# Patient Record
Sex: Male | Born: 1995 | Race: White | Hispanic: No | Marital: Married | Smoking: Never smoker
Health system: Southern US, Community
[De-identification: ages and names within clinical notes are randomized; demographics above are authoritative.]

---
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
## 2016-12-31 ENCOUNTER — Other Ambulatory Visit: Payer: Self-pay | Admitting: Occupational Medicine

## 2016-12-31 ENCOUNTER — Ambulatory Visit
Admission: RE | Admit: 2016-12-31 | Discharge: 2016-12-31 | Disposition: A | Discharge status: Home / Self Care | Payer: No Typology Code available for payment source | Source: Ambulatory Visit | Attending: Occupational Medicine | Admitting: Occupational Medicine

## 2016-12-31 DIAGNOSIS — Z021 Encounter for pre-employment examination: Secondary | ICD-10-CM

## 2017-04-04 ENCOUNTER — Ambulatory Visit
Admission: RE | Admit: 2017-04-04 | Discharge: 2017-04-04 | Disposition: A | Discharge status: Home / Self Care | Payer: Worker's Compensation | Source: Ambulatory Visit | Attending: Nurse Practitioner | Admitting: Nurse Practitioner

## 2017-04-04 ENCOUNTER — Other Ambulatory Visit: Payer: Self-pay | Admitting: Nurse Practitioner

## 2017-04-04 DIAGNOSIS — M25572 Pain in left ankle and joints of left foot: Secondary | ICD-10-CM

## 2017-04-04 DIAGNOSIS — R609 Edema, unspecified: Secondary | ICD-10-CM
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
## 2019-09-22 ENCOUNTER — Other Ambulatory Visit: Payer: Self-pay | Admitting: Family Medicine

## 2019-09-22 DIAGNOSIS — N5089 Other specified disorders of the male genital organs: Secondary | ICD-10-CM

## 2019-09-22 DIAGNOSIS — R1909 Other intra-abdominal and pelvic swelling, mass and lump: Secondary | ICD-10-CM

## 2019-09-30 ENCOUNTER — Other Ambulatory Visit: Payer: Self-pay

## 2019-09-30 ENCOUNTER — Ambulatory Visit
Admission: RE | Admit: 2019-09-30 | Discharge: 2019-09-30 | Disposition: A | Discharge status: Home / Self Care | Payer: 59 | Source: Ambulatory Visit | Attending: Family Medicine | Admitting: Family Medicine

## 2019-09-30 DIAGNOSIS — R1909 Other intra-abdominal and pelvic swelling, mass and lump: Secondary | ICD-10-CM

## 2019-09-30 DIAGNOSIS — N5089 Other specified disorders of the male genital organs: Secondary | ICD-10-CM

## 2020-05-15 ENCOUNTER — Encounter (HOSPITAL_BASED_OUTPATIENT_CLINIC_OR_DEPARTMENT_OTHER): Payer: Self-pay | Admitting: Emergency Medicine

## 2020-05-15 ENCOUNTER — Other Ambulatory Visit: Payer: Self-pay

## 2020-05-15 ENCOUNTER — Emergency Department (HOSPITAL_BASED_OUTPATIENT_CLINIC_OR_DEPARTMENT_OTHER): Payer: No Typology Code available for payment source

## 2020-05-15 ENCOUNTER — Emergency Department (HOSPITAL_BASED_OUTPATIENT_CLINIC_OR_DEPARTMENT_OTHER)
Admission: EM | Admit: 2020-05-15 | Discharge: 2020-05-15 | Disposition: A | Discharge status: Home / Self Care | Payer: No Typology Code available for payment source | Attending: Emergency Medicine | Admitting: Emergency Medicine

## 2020-05-15 DIAGNOSIS — Y9339 Activity, other involving climbing, rappelling and jumping off: Secondary | ICD-10-CM | POA: Insufficient documentation

## 2020-05-15 DIAGNOSIS — S6991XA Unspecified injury of right wrist, hand and finger(s), initial encounter: Secondary | ICD-10-CM | POA: Diagnosis not present

## 2020-05-15 DIAGNOSIS — X501XXA Overexertion from prolonged static or awkward postures, initial encounter: Secondary | ICD-10-CM | POA: Diagnosis not present

## 2020-05-15 DIAGNOSIS — Y990 Civilian activity done for income or pay: Secondary | ICD-10-CM | POA: Diagnosis not present

## 2020-05-15 MED ORDER — IBUPROFEN 400 MG PO TABS
600.0000 mg | ORAL_TABLET | Freq: Once | ORAL | Status: AC
  Administered 2020-05-15: 600 mg via ORAL
  Filled 2020-05-15: qty 1
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
## 2020-05-15 NOTE — Discharge Instructions (Signed)
Your x-ray does not show any evidence of fracture and I suspect a wrist sprain, use brace for support, you can ice and elevate the wrist and use Motrin and Tylenol for pain.  If pain is not improving after 1 week please follow-up with Dr. Jordan Likes with sports medicine.  In some cases people can have a scaphoid fracture injury that does not show up on initial imaging.

## 2020-05-15 NOTE — ED Triage Notes (Signed)
Reports hyperextended wrist while jumping a fence at work.

## 2020-06-08 ENCOUNTER — Telehealth: Payer: Self-pay | Admitting: Family Medicine

## 2020-06-08 NOTE — Telephone Encounter (Signed)
Called pt to offer ED follow-up appt w/ Dr.Schmitz--no answer, message left. --glh

## 2020-06-22 ENCOUNTER — Encounter: Payer: Self-pay | Admitting: Family Medicine

## 2020-06-22 ENCOUNTER — Other Ambulatory Visit: Payer: Self-pay

## 2020-06-22 ENCOUNTER — Ambulatory Visit: Payer: Self-pay

## 2020-06-22 ENCOUNTER — Ambulatory Visit (INDEPENDENT_AMBULATORY_CARE_PROVIDER_SITE_OTHER): Payer: Worker's Compensation | Admitting: Family Medicine

## 2020-06-22 VITALS — BP 119/83 | HR 80 | Ht 73.0 in | Wt 230.0 lb

## 2020-06-22 DIAGNOSIS — M25531 Pain in right wrist: Secondary | ICD-10-CM

## 2020-06-22 DIAGNOSIS — S63591A Other specified sprain of right wrist, initial encounter: Secondary | ICD-10-CM | POA: Diagnosis not present

## 2020-06-22 NOTE — Patient Instructions (Signed)
Nice to meet you Please use ice as needed  Please try to keep your wrist in neutral position with lifting   Please send me a message in MyChart with any questions or updates.  We will setup a virtual visit on the MRI is resulted.   --Dr. Jordan Likes

## 2020-06-22 NOTE — Assessment & Plan Note (Signed)
Injury occurred around 10/25 while at work.  Has had ongoing pain since this time.  No improvement with conservative measures or medications.  Has an effusion on exam and suspect either a TFCC tear or a tear of the scapholunate ligament. -Counseled on home exercise therapy and supportive care. -MRI to evaluate for TFCC tear.

## 2020-06-22 NOTE — Progress Notes (Signed)
Aaron Abbott - 24 y.o. male MRN [id number]  Date of birth: 1995-12-25  SUBJECTIVE:  Including CC & ROS.  Chief Complaint  Patient presents with  . Wrist Injury    right    Aaron Abbott is a 24 y.o. male that is presenting with right wrist pain.  The pain is over the dorsum of the wrist.  Has been ongoing since his injury at work.  Occurs intermittently with his wrist in extension.  No previous injury or surgery to the right wrist.  Localized to the wrist..  Independent review of the right wrist x-ray from 10/24 shows no acute changes.   Review of Systems See HPI   HISTORY: Past Medical, Surgical, Social, and Family History Reviewed & Updated per EMR.   Pertinent Historical Findings include:  No past medical history on file.  No past surgical history on file.  Family History  Problem Relation Age of Onset  . Sudden death Neg Hx   . Heart attack Neg Hx     Social History   Socioeconomic History  . Marital status: Married    Spouse name: Not on file  . Number of children: Not on file  . Years of education: Not on file  . Highest education level: Not on file  Occupational History  . Not on file  Tobacco Use  . Smoking status: Never Smoker  . Smokeless tobacco: Never Used  Substance and Sexual Activity  . Alcohol use: Not Currently  . Drug use: Not Currently  . Sexual activity: Not on file  Other Topics Concern  . Not on file  Social History Narrative  . Not on file   Social Determinants of Health   Financial Resource Strain:   . Difficulty of Paying Living Expenses: Not on file  Food Insecurity:   . Worried About Programme researcher, broadcasting/film/video in the Last Year: Not on file  . Ran Out of Food in the Last Year: Not on file  Transportation Needs:   . Lack of Transportation (Medical): Not on file  . Lack of Transportation (Non-Medical): Not on file  Physical Activity:   . Days of Exercise per Week: Not on file  . Minutes of Exercise per Session: Not on file   Stress:   . Feeling of Stress : Not on file  Social Connections:   . Frequency of Communication with Friends and Family: Not on file  . Frequency of Social Gatherings with Friends and Family: Not on file  . Attends Religious Services: Not on file  . Active Member of Clubs or Organizations: Not on file  . Attends Banker Meetings: Not on file  . Marital Status: Not on file  Intimate Partner Violence:   . Fear of Current or Ex-Partner: Not on file  . Emotionally Abused: Not on file  . Physically Abused: Not on file  . Sexually Abused: Not on file     PHYSICAL EXAM:  VS: BP 119/83   Pulse 80   Ht 6\' 1"  (1.854 m)   Wt 230 lb (104.3 kg)   BMI 30.34 kg/m  Physical Exam Gen: NAD, alert, cooperative with exam, well-appearing MSK:  Right wrist: No ecchymosis or swelling. Normal range of motion. Normal grip strength. Normal strength resistance. Tenderness palpation over the dorsum of the wrist. Negative clunk test. Neurovascular intact  Limited ultrasound: Right wrist:  No change of the distal radius. Has no effusion in the radius scaphoid joint. Normal measurements of the scapholunate ligament.  With no opening on dynamic testing. No changes of the scaphoid  Summary: There is no effusion within the wrist joint Suspect possible TFCC tear.  Ultrasound and interpretation by Clare Gandy, MD    ASSESSMENT & PLAN:   Complex tear of triangular fibrocartilage of right wrist Injury occurred around 10/25 while at work.  Has had ongoing pain since this time.  No improvement with conservative measures or medications.  Has an effusion on exam and suspect either a TFCC tear or a tear of the scapholunate ligament. -Counseled on home exercise therapy and supportive care. -MRI to evaluate for TFCC tear.

## 2020-08-23 ENCOUNTER — Telehealth: Payer: Self-pay | Admitting: Family Medicine
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
## 2020-09-02 ENCOUNTER — Other Ambulatory Visit: Payer: Self-pay | Admitting: *Deleted

## 2020-09-28 ENCOUNTER — Telehealth (INDEPENDENT_AMBULATORY_CARE_PROVIDER_SITE_OTHER): Payer: Worker's Compensation | Admitting: Family Medicine

## 2020-09-28 ENCOUNTER — Other Ambulatory Visit: Payer: Self-pay

## 2020-09-28 DIAGNOSIS — S63591A Other specified sprain of right wrist, initial encounter: Secondary | ICD-10-CM

## 2020-09-28 NOTE — Assessment & Plan Note (Signed)
MRI was confirming the TFCC tear.  This was the result of an injury sustained at work on 10/25. -Counseled on home exercise therapy and supportive care. -Referral to orthopedic surgery for hand specialist.

## 2020-09-28 NOTE — Progress Notes (Signed)
Virtual Visit via Video Note  I connected with Aaron Abbott on 09/28/20 at  1:20 PM EST by a video enabled telemedicine application and verified that I am speaking with the correct person using two identifiers.  Location: Patient: home Provider: office   I discussed the limitations of evaluation and management by telemedicine and the availability of in person appointments. The patient expressed understanding and agreed to proceed.  History of Present Illness:  Mr. Autry is a 25 year old male that is following up after the MRI of his right wrist.  This was demonstrating a tear of the TFCC.  Has been ongoing since his injury at work on October.   Observations/Objective:  Gen: NAD, alert, cooperative with exam, well-appearing  Assessment and Plan:  TCC tear of the right wrist: MRI was confirming the TFCC tear.  This was the result of an injury sustained at work on 10/25. -Counseled on home exercise therapy and supportive care. -Referral to orthopedic surgery for hand specialist.  Follow Up Instructions:    I discussed the assessment and treatment plan with the patient. The patient was provided an opportunity to ask questions and all were answered. The patient agreed with the plan and demonstrated an understanding of the instructions.   The patient was advised to call back or seek an in-person evaluation if the symptoms worsen or if the condition fails to improve as anticipated.    Clare Gandy, MD

## 2020-11-16 ENCOUNTER — Encounter: Payer: Self-pay | Admitting: Family Medicine

## 2020-11-23 ENCOUNTER — Encounter: Payer: Self-pay | Admitting: Family Medicine

## 2022-11-05 ENCOUNTER — Encounter: Payer: Self-pay | Admitting: *Deleted

## 2023-11-30 ENCOUNTER — Emergency Department (HOSPITAL_BASED_OUTPATIENT_CLINIC_OR_DEPARTMENT_OTHER)

## 2023-11-30 ENCOUNTER — Encounter (HOSPITAL_BASED_OUTPATIENT_CLINIC_OR_DEPARTMENT_OTHER): Payer: Self-pay | Admitting: *Deleted

## 2023-11-30 ENCOUNTER — Emergency Department (HOSPITAL_BASED_OUTPATIENT_CLINIC_OR_DEPARTMENT_OTHER)
Admission: EM | Admit: 2023-11-30 | Discharge: 2023-11-30 | Disposition: A | Discharge status: Home / Self Care | Attending: Emergency Medicine | Admitting: Emergency Medicine

## 2023-11-30 ENCOUNTER — Other Ambulatory Visit: Payer: Self-pay

## 2023-11-30 DIAGNOSIS — Y9302 Activity, running: Secondary | ICD-10-CM | POA: Insufficient documentation

## 2023-11-30 DIAGNOSIS — X509XXA Other and unspecified overexertion or strenuous movements or postures, initial encounter: Secondary | ICD-10-CM | POA: Diagnosis not present

## 2023-11-30 DIAGNOSIS — S93492A Sprain of other ligament of left ankle, initial encounter: Secondary | ICD-10-CM | POA: Insufficient documentation

## 2023-11-30 DIAGNOSIS — M25572 Pain in left ankle and joints of left foot: Secondary | ICD-10-CM | POA: Diagnosis present

## 2023-11-30 DIAGNOSIS — S93402A Sprain of unspecified ligament of left ankle, initial encounter: Secondary | ICD-10-CM

## 2023-11-30 NOTE — Discharge Instructions (Addendum)
 You were seen here today for your ankle pain.  It appears you have a ankle sprain which is a injury of the ligaments in your ankle.  This will heal with time.  Your x-ray today did not show any signs of a fracture or dislocation  You have been placed into a walking boot to help with the pain.  You may wean out of the boot as tolerated. Please perform gentle range of motion exercises as provided (like "writing" the ABC's with your ankle) to prevent stiffness in the ankle and to strengthen the ankle.  Please follow-up with the orthopedic provider listed below within the next week for further management of your ankle sprain.  You may take up to 1000mg  of tylenol every 6 hours as needed for pain.  Do not take more then 4g per day.  You may use up to 600mg  ibuprofen  every 6 hours as needed for pain.  Do not exceed 2.4g of ibuprofen  per day.  You may ice and elevate the ankle to help with pain.  Return to the ER if you have any numbness or tingling in your foot, worsening pain, any other new or concerning symptoms.
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
## 2023-11-30 NOTE — ED Triage Notes (Signed)
 Left ankle pain after rolling ankle this evening. Patient able to bear weight, no obvious deformity.
